# Patient Record
Sex: Male | Born: 2000 | Hispanic: No | Marital: Single | Smoking: Never smoker
Health system: Southern US, Community
[De-identification: ages and names within clinical notes are randomized; demographics above are authoritative.]

## PROBLEM LIST (undated history)

## (undated) DIAGNOSIS — Z789 Other specified health status: Secondary | ICD-10-CM

## (undated) HISTORY — PX: NO PAST SURGERIES: SHX2092

---
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
## 2022-01-17 ENCOUNTER — Encounter (HOSPITAL_COMMUNITY): Payer: Self-pay

## 2022-01-17 ENCOUNTER — Emergency Department (HOSPITAL_COMMUNITY)
Admission: EM | Admit: 2022-01-17 | Discharge: 2022-01-17 | Disposition: A | Discharge status: Home / Self Care | Payer: Medicaid Other | Attending: Emergency Medicine | Admitting: Emergency Medicine

## 2022-01-17 DIAGNOSIS — L0501 Pilonidal cyst with abscess: Secondary | ICD-10-CM | POA: Insufficient documentation

## 2022-01-17 MED ORDER — LIDOCAINE-EPINEPHRINE (PF) 2 %-1:200000 IJ SOLN
10.0000 mL | Freq: Once | INTRAMUSCULAR | Status: AC
  Administered 2022-01-17: 10 mL
  Filled 2022-01-17: qty 20

## 2022-01-17 NOTE — ED Provider Notes (Signed)
[facility]GENCY [facility]PT Provider Note   CSN: [id number] Arrival date & time: 01/17/22  1534     History  Chief Complaint  Patient presents with   Cyst    Brandon Santiago is a 21 y.o. male.  Patient with no pertinent past medical history presents today with complaints of gluteal abscess.  He states that same has been present for the past 2 months. He states that he will regularly pop it and drain bloody purulence, but soon after it comes back. He states that same is located in his upper gluteal cleft. Denies any pain or difficulty with bowel movements. Denies any fevers or chills.        Home Medications Prior to Admission medications   Not on File      Allergies    Patient has no known allergies.    Review of Systems   Review of Systems  Skin:  Positive for wound.  All other systems reviewed and are negative.  Physical Exam Updated Vital Signs BP 135/75   Pulse 91   Temp 98.4 F (36.9 C) (Oral)   Resp 18   SpO2 100%  Physical Exam Vitals and nursing note reviewed. Exam conducted with a chaperone present.  Constitutional:      General: He is not in acute distress.    Appearance: Normal appearance. He is normal weight. He is not ill-appearing, toxic-appearing or diaphoretic.  HENT:     Head: Normocephalic and atraumatic.  Cardiovascular:     Rate and Rhythm: Normal rate.  Pulmonary:     Effort: Pulmonary effort is normal. No respiratory distress.  Genitourinary:    Comments: Small pilonidal cyst present in the superior left side of the gluteal cleft. Fluctuance with surrounding induration present. No cellulitic skin changes. No active drainage Musculoskeletal:        General: Normal range of motion.     Cervical back: Normal range of motion.  Skin:    General: Skin is warm and dry.  Neurological:     General: No focal deficit present.     Mental Status: He is alert.  Psychiatric:        Mood and Affect: Mood normal.         Behavior: Behavior normal.    ED Results / Procedures / Treatments   Labs (all labs ordered are listed, but only abnormal results are displayed) Labs Reviewed - No data to display  EKG None  Radiology No results found.  Procedures .Marland KitchenIncision and Drainage  Date/Time: 01/17/2022 4:29 PM Performed by: Silva Bandy, PA-C Authorized by: Silva Bandy, PA-C   Consent:    Consent obtained:  Verbal   Consent given by:  Patient   Risks, benefits, and alternatives were discussed: yes     Risks discussed:  Bleeding, incomplete drainage, pain, damage to other organs and infection   Alternatives discussed:  No treatment Universal protocol:    Procedure explained and questions answered to patient or proxy's satisfaction: yes     Patient identity confirmed:  Verbally with patient Location:    Type:  Pilonidal cyst   Size:  1 cm   Location:  Anogenital   Anogenital location:  Pilonidal Pre-procedure details:    Skin preparation:  Povidone-iodine Anesthesia:    Anesthesia method:  Local infiltration   Local anesthetic:  Lidocaine 2% WITH epi Procedure type:    Complexity:  Simple Procedure details:    Incision types:  Cruciate   Wound management:  Probed and deloculated   Drainage:  Bloody and purulent   Drainage amount:  Moderate   Wound treatment:  Wound left open   Packing materials:  None Post-procedure details:    Procedure completion:  Tolerated well, no immediate complications    Medications Ordered in ED Medications  lidocaine-EPINEPHrine (XYLOCAINE W/EPI) 2 %-1:200000 (PF) injection 10 mL (has no administration in time range)    ED Course/ Medical Decision Making/ A&P                           Medical Decision Making Risk Prescription drug management.   Patient with pilonidal cyst amenable to incision and drainage.  Abscess was not large enough to warrant packing or drain, recommend wound recheck in 2 days. Encouraged home warm soaks and flushing.  Mild  signs of cellulitis is surrounding skin. No concern for tracking fistula.  Will d/c to home.  No antibiotic therapy is indicated.  We will give referral to general surgery for curative management of the patient's symptoms as he states he has been struggling with this for some time.  Patient is understanding and amenable to plan, educated on red flag symptoms of prompt immediate return.  Discharged in stable condition.   Final Clinical Impression(s) / ED Diagnoses Final diagnoses:  Pilonidal abscess    Rx / DC Orders ED Discharge Orders     None     An After Visit Summary was printed and given to the patient.     Vear Clock 01/17/22 1637    Lorre Nick, MD 01/19/22 1500

## 2022-04-05 ENCOUNTER — Ambulatory Visit (HOSPITAL_COMMUNITY)
Admission: EM | Admit: 2022-04-05 | Discharge: 2022-04-05 | Disposition: A | Discharge status: Home / Self Care | Payer: Medicaid Other | Attending: Emergency Medicine | Admitting: Emergency Medicine

## 2022-04-05 ENCOUNTER — Encounter (HOSPITAL_COMMUNITY): Payer: Self-pay

## 2022-04-05 DIAGNOSIS — L0291 Cutaneous abscess, unspecified: Secondary | ICD-10-CM | POA: Diagnosis not present

## 2022-04-05 MED ORDER — DOXYCYCLINE HYCLATE 100 MG PO CAPS: 100.0000 mg | ORAL_CAPSULE | Freq: Two times a day (BID) | ORAL | 0 refills | Status: DC

## 2022-04-05 NOTE — ED Triage Notes (Signed)
The pt states he has a bump to his left lower back for about 5 months. The abscess is closed and scabbed over. The patient states at times there is drainage to the area.

## 2022-04-05 NOTE — Discharge Instructions (Addendum)
Please take medication as prescribed. Take with food to avoid upset stomach.  Follow up with your new primary care provider.

## 2022-04-05 NOTE — ED Provider Notes (Signed)
[facility]    CSN: [id number] Arrival date & time: 04/05/22  1240     History   Chief Complaint Chief Complaint  Patient presents with   Abscess    HPI Brandon Santiago is a 21 y.o. male.  Presents with abscess to the lower back x 5 months. Has been on and off but always recurs in the same place. Sometimes it can be tender and grow in size.  Drainage on and off as well. He has not had it drained in the past. Denies any fever, chills, back pain, abdominal pain.  History reviewed. No pertinent past medical history.  There are no problems to display for this patient.   History reviewed. No pertinent surgical history.     Home Medications    Prior to Admission medications   Medication Sig Start Date End Date Taking? Authorizing Provider  doxycycline (VIBRAMYCIN) 100 MG capsule Take 1 capsule (100 mg total) by mouth 2 (two) times daily for 7 days. 04/05/22 04/12/22 Yes Marrianne Sica, Lurena Joiner, PA-C  ibuprofen (ADVIL,MOTRIN) 600 MG tablet Take 1 tablet (600 mg total) by mouth every 8 (eight) hours as needed for fever or moderate pain. 09/07/18   Blane Ohara, MD    Family History History reviewed. No pertinent family history.  Social History Social History   Tobacco Use   Smoking status: Never  Substance Use Topics   Alcohol use: No   Drug use: Not Currently    Types: Marijuana     Allergies   Patient has no known allergies.   Review of Systems Review of Systems Per HPI  Physical Exam Triage Vital Signs ED Triage Vitals  Enc Vitals Group     BP 04/05/22 1330 134/64     Pulse Rate 04/05/22 1330 85     Resp 04/05/22 1330 16     Temp 04/05/22 1330 98.5 F (36.9 C)     Temp Source 04/05/22 1330 Oral     SpO2 04/05/22 1330 100 %     Weight --      Height --      Head Circumference --      Peak Flow --      Pain Score 04/05/22 1329 0     Pain Loc --      Pain Edu? --      Excl. in GC? --    No data found.  Updated Vital Signs BP  134/64 (BP Location: Left Arm)   Pulse 85   Temp 98.5 F (36.9 C) (Oral)   Resp 16   SpO2 100%    Physical Exam Vitals and nursing note reviewed.  Constitutional:      General: He is not in acute distress.    Appearance: Normal appearance.  HENT:     Mouth/Throat:     Pharynx: Oropharynx is clear.  Eyes:     Conjunctiva/sclera: Conjunctivae normal.  Cardiovascular:     Rate and Rhythm: Normal rate and regular rhythm.     Pulses: Normal pulses.     Heart sounds: Normal heart sounds.  Pulmonary:     Effort: Pulmonary effort is normal. No respiratory distress.     Breath sounds: Normal breath sounds. No wheezing.  Musculoskeletal:       Back:     Comments: There is a small, open and draining abscess at the top of the left gluteal cleft. Purulent fluid. Somewhat tender  Skin:    Findings: Abscess present.  Neurological:     Mental  Status: He is alert and oriented to person, place, and time.     UC Treatments / Results  Labs (all labs ordered are listed, but only abnormal results are displayed) Labs Reviewed - No data to display  EKG  Radiology No results found.  Procedures Procedures   Medications Ordered in UC Medications - No data to display  Initial Impression / Assessment and Plan / UC Course  I have reviewed the triage vital signs and the nursing notes.  Pertinent labs & imaging results that were available during my care of the patient were reviewed by me and considered in my medical decision making (see chart for details).  Doxy twice daily for 7 days. Set patient up with primary care provider for next Thursday. May need referral to dermatology for recurrent abscess. Return precautions discussed. Patient agrees to plan  Final Clinical Impressions(s) / UC Diagnoses   Final diagnoses:  Abscess     Discharge Instructions      Please take medication as prescribed. Take with food to avoid upset stomach.  Follow up with your new primary care  provider.     ED Prescriptions     Medication Sig Dispense Auth. Provider   doxycycline (VIBRAMYCIN) 100 MG capsule Take 1 capsule (100 mg total) by mouth 2 (two) times daily for 7 days. 14 capsule Dalvin Clipper, Lurena Joiner, PA-C      PDMP not reviewed this encounter.   Basma Buchner, Lurena Joiner, [place] 04/05/22 1411

## 2022-04-06 ENCOUNTER — Encounter (HOSPITAL_COMMUNITY): Payer: Self-pay

## 2022-04-08 NOTE — Progress Notes (Unsigned)
   New Patient Visit  There were no vitals taken for this visit.   Subjective:    Patient ID: Brandon Santiago, male    DOB: 04-04-2001, 21 y.o.   MRN: [id number]  CC: No chief complaint on file.   HPI: Brandon Santiago is a 21 y.o. male presents for new patient visit to establish care.  Introduced to Publishing rights manager role and practice setting.  All questions answered.  Discussed provider/patient relationship and expectations.  No past medical history on file.  No past surgical history on file.  No family history on file.   Social History   Tobacco Use   Smoking status: Never  Substance Use Topics   Alcohol use: No   Drug use: Not Currently    Types: Marijuana    Current Outpatient Medications on File Prior to Visit  Medication Sig Dispense Refill   doxycycline (VIBRAMYCIN) 100 MG capsule Take 1 capsule (100 mg total) by mouth 2 (two) times daily for 7 days. 14 capsule 0   ibuprofen (ADVIL,MOTRIN) 600 MG tablet Take 1 tablet (600 mg total) by mouth every 8 (eight) hours as needed for fever or moderate pain. 30 tablet 0   No current facility-administered medications on file prior to visit.     Review of Systems      Objective:    There were no vitals taken for this visit.  Wt Readings from Last 3 Encounters:  10/12/21 170 lb (77.1 kg) (75 %, Z= 0.67)*  09/07/18 177 lb 0.5 oz (80.3 kg) (93 %, Z= 1.51)*   * Growth percentiles are based on CDC (Boys, 2-20 Years) data.    BP Readings from Last 3 Encounters:  04/05/22 134/64  01/17/22 113/65  10/12/21 130/68    Physical Exam     Assessment & Plan:   Problem List Items Addressed This Visit   None    Follow up plan: No follow-ups on file.

## 2022-04-09 ENCOUNTER — Encounter: Payer: Self-pay | Admitting: Nurse Practitioner

## 2022-04-09 ENCOUNTER — Ambulatory Visit (INDEPENDENT_AMBULATORY_CARE_PROVIDER_SITE_OTHER): Payer: Medicaid Other | Admitting: Nurse Practitioner

## 2022-04-09 VITALS — BP 120/90 | HR 75 | Temp 98.5°F | Ht 66.0 in | Wt 176.8 lb

## 2022-04-09 DIAGNOSIS — L0291 Cutaneous abscess, unspecified: Secondary | ICD-10-CM

## 2022-04-09 NOTE — Patient Instructions (Signed)
It was great to see you!  I have placed a referral to surgery to help remove the abscess on your back.   You can use warm compresses several times a day to help the area drain.   Let's follow-up in 2-3 months, sooner if you have concerns.  If a referral was placed today, you will be contacted for an appointment. Please note that routine referrals can sometimes take up to 3-4 weeks to process. Please call our office if you haven't heard anything after this time frame.  Take care,  Rodman Pickle, NP

## 2022-07-02 ENCOUNTER — Ambulatory Visit (INDEPENDENT_AMBULATORY_CARE_PROVIDER_SITE_OTHER): Payer: Medicaid Other | Admitting: Nurse Practitioner

## 2022-07-02 ENCOUNTER — Encounter: Payer: Self-pay | Admitting: Nurse Practitioner

## 2022-07-02 VITALS — BP 120/76 | HR 79 | Temp 98.2°F | Ht 66.0 in | Wt 183.6 lb

## 2022-07-02 DIAGNOSIS — Z0000 Encounter for general adult medical examination without abnormal findings: Secondary | ICD-10-CM | POA: Diagnosis not present

## 2022-07-02 DIAGNOSIS — L0291 Cutaneous abscess, unspecified: Secondary | ICD-10-CM | POA: Diagnosis not present

## 2022-07-02 DIAGNOSIS — R7301 Impaired fasting glucose: Secondary | ICD-10-CM | POA: Diagnosis not present

## 2022-07-02 LAB — COMPREHENSIVE METABOLIC PANEL
ALT: 33 U/L (ref 0–53)
AST: 22 U/L (ref 0–37)
Albumin: 4.7 g/dL (ref 3.5–5.2)
Alkaline Phosphatase: 85 U/L (ref 52–171)
BUN: 18 mg/dL (ref 6–23)
CO2: 29 mEq/L (ref 19–32)
Calcium: 9.5 mg/dL (ref 8.4–10.5)
Chloride: 104 mEq/L (ref 96–112)
Creatinine, Ser: 0.77 mg/dL (ref 0.40–1.50)
GFR: 129.74 mL/min (ref >60.00)
Glucose, Bld: 101 mg/dL — ABNORMAL HIGH (ref 70–99)
Potassium: 4.1 mEq/L (ref 3.5–5.1)
Sodium: 139 mEq/L (ref 135–145)
Total Bilirubin: 1 mg/dL (ref 0.2–1.2)
Total Protein: 7.3 g/dL (ref 6.0–8.3)

## 2022-07-02 LAB — CBC WITH DIFFERENTIAL/PLATELET
Basophils Absolute: 0 10*3/uL (ref 0.0–0.1)
Basophils Relative: 0.5 % (ref 0.0–3.0)
Eosinophils Absolute: 0.1 10*3/uL (ref 0.0–0.7)
Eosinophils Relative: 1.4 % (ref 0.0–5.0)
HCT: 50.2 % — ABNORMAL HIGH (ref 36.0–49.0)
Hemoglobin: 17 g/dL — ABNORMAL HIGH (ref 12.0–16.0)
Lymphocytes Relative: 21 % — ABNORMAL LOW (ref 24.0–48.0)
Lymphs Abs: 1.4 10*3/uL (ref 0.7–4.0)
MCHC: 33.9 g/dL (ref 31.0–37.0)
MCV: 91.6 fl (ref 78.0–98.0)
Monocytes Absolute: 0.7 10*3/uL (ref 0.1–1.0)
Monocytes Relative: 11 % (ref 3.0–12.0)
Neutro Abs: 4.4 10*3/uL (ref 1.4–7.7)
Neutrophils Relative %: 66.1 % (ref 43.0–71.0)
Platelets: 289 10*3/uL (ref 150.0–575.0)
RBC: 5.48 Mil/uL (ref 3.80–5.70)
RDW: 13.7 % (ref 11.4–15.5)
WBC: 6.6 10*3/uL (ref 4.5–13.5)

## 2022-07-02 LAB — HEMOGLOBIN A1C: Hgb A1c MFr Bld: 5.4 % (ref 4.6–6.5)

## 2022-07-02 NOTE — Progress Notes (Signed)
BP 120/76   Pulse 79   Temp 98.2 F (36.8 C) (Temporal)   Ht 5\' 6"  (1.676 m)   Wt 183 lb 9.6 oz (83.3 kg)   SpO2 98%   BMI 29.63 kg/m    Subjective:    Patient ID: Brandon Santiago, male    DOB: 17-Nov-2000, 21 y.o.   MRN: [id number]  HPI: Brandon Santiago is a 21 y.o. male presenting on 07/02/2022 for comprehensive medical examination. Current medical complaints include: abscess on back  He has not heard from [facility] [place] [facility] about the abscess on his back. He states that it is about the same right now. He denies fevers.   He currently lives with: mom  Depression Screen done today and results listed below:     07/02/2022    8:46 AM 04/09/2022    8:53 AM  Depression screen PHQ 2/9  Decreased Interest 0 0  Down, Depressed, Hopeless 0 0  PHQ - 2 Score 0 0  Altered sleeping  0  Tired, decreased energy  0  Change in appetite  1  Feeling bad or failure about yourself   0  Trouble concentrating  0  Moving slowly or fidgety/restless  0  Suicidal thoughts  0  PHQ-9 Score  1  Difficult doing work/chores  Not difficult at all    The patient does not have a history of falls. I did not complete a risk assessment for falls. A plan of care for falls was not documented.   Past Medical History:  History reviewed. No pertinent past medical history.  Surgical History:  History reviewed. No pertinent surgical history.  Medications:  No current outpatient medications on file prior to visit.   No current facility-administered medications on file prior to visit.    Allergies:  No Known Allergies  Social History:  Social History   Socioeconomic History   Marital status: Single    Spouse name: Not on file   Number of children: Not on file   Years of education: Not on file   Highest education level: Not on file  Occupational History   Not on file  Tobacco Use   Smoking status: Never   Smokeless tobacco: Never  Vaping Use   Vaping Use: Never used   Substance and Sexual Activity   Alcohol use: No   Drug use: Yes    Types: Marijuana   Sexual activity: Yes  Other Topics Concern   Not on file  Social History Narrative   ** Merged History Encounter **       Social Determinants of Health   Financial Resource Strain: Not on file  Food Insecurity: Not on file  Transportation Needs: Not on file  Physical Activity: Not on file  Stress: Not on file  Social Connections: Not on file  Intimate Partner Violence: Not on file   Social History   Tobacco Use  Smoking Status Never  Smokeless Tobacco Never   Social History   Substance and Sexual Activity  Alcohol Use No    Family History:  Family History  Problem Relation Age of Onset   Healthy Mother    Healthy Father     Past medical history, surgical history, medications, allergies, family history and social history reviewed with patient today and changes made to appropriate areas of the chart.   Review of Systems  Constitutional: Negative.   HENT: Negative.    Respiratory: Negative.    Cardiovascular: Negative.   Gastrointestinal: Negative.   Genitourinary: Negative.  Musculoskeletal: Negative.   Skin:        Abscess lower back  Neurological: Negative.   Psychiatric/Behavioral: Negative.     All other ROS negative except what is listed above and in the HPI.      Objective:    BP 120/76   Pulse 79   Temp 98.2 F (36.8 C) (Temporal)   Ht 5\' 6"  (1.676 m)   Wt 183 lb 9.6 oz (83.3 kg)   SpO2 98%   BMI 29.63 kg/m   Wt Readings from Last 3 Encounters:  07/02/22 183 lb 9.6 oz (83.3 kg) (84 %, Z= 1.00)*  04/09/22 176 lb 12.8 oz (80.2 kg) (79 %, Z= 0.82)*  10/12/21 170 lb (77.1 kg) (75 %, Z= 0.67)*   * Growth percentiles are based on CDC (Boys, 2-20 Years) data.    Physical Exam Vitals and nursing note reviewed.  Constitutional:      General: He is not in acute distress.    Appearance: Normal appearance.  HENT:     Head: Normocephalic and atraumatic.      Right Ear: Tympanic membrane, ear canal and external ear normal.     Left Ear: Tympanic membrane, ear canal and external ear normal.  Eyes:     Conjunctiva/sclera: Conjunctivae normal.  Cardiovascular:     Rate and Rhythm: Normal rate and regular rhythm.     Pulses: Normal pulses.     Heart sounds: Normal heart sounds.  Pulmonary:     Effort: Pulmonary effort is normal.     Breath sounds: Normal breath sounds.  Abdominal:     Palpations: Abdomen is soft.     Tenderness: There is no abdominal tenderness.  Musculoskeletal:        General: Normal range of motion.     Cervical back: Normal range of motion and neck supple. No tenderness.     Right lower leg: No edema.     Left lower leg: No edema.  Lymphadenopathy:     Cervical: No cervical adenopathy.  Skin:    General: Skin is warm and dry.     Comments: Pilonidal abscess, slightly draining  Neurological:     General: No focal deficit present.     Mental Status: He is alert and oriented to person, place, and time.     Cranial Nerves: No cranial nerve deficit.     Coordination: Coordination normal.     Gait: Gait normal.  Psychiatric:        Mood and Affect: Mood normal.        Behavior: Behavior normal.        Thought Content: Thought content normal.        Judgment: Judgment normal.     Results for orders placed or performed during the hospital encounter of 10/12/21  Resp Panel by RT-PCR (Flu A&B, Covid) Nasopharyngeal Swab   Specimen: Nasopharyngeal Swab; Nasopharyngeal(NP) swabs in vial transport medium  Result Value Ref Range   SARS Coronavirus 2 by RT PCR NEGATIVE NEGATIVE   Influenza A by PCR NEGATIVE NEGATIVE   Influenza B by PCR NEGATIVE NEGATIVE  Comprehensive metabolic panel  Result Value Ref Range   Sodium 135 135 - 145 mmol/L   Potassium 3.2 (L) 3.5 - 5.1 mmol/L   Chloride 100 98 - 111 mmol/L   CO2 22 22 - 32 mmol/L   Glucose, Bld 120 (H) 70 - 99 mg/dL   BUN 11 6 - 20 mg/dL   Creatinine, Ser 10/14/21 0.61 -  1.24 mg/dL   Calcium 9.2 8.9 - 10.3 mg/dL   Total Protein 7.1 6.5 - 8.1 g/dL   Albumin 4.1 3.5 - 5.0 g/dL   AST 25 15 - 41 U/L   ALT 43 0 - 44 U/L   Alkaline Phosphatase 90 38 - 126 U/L   Total Bilirubin 1.3 (H) 0.3 - 1.2 mg/dL   GFR, Estimated >60 >60 mL/min   Anion gap 13 5 - 15  CBC  Result Value Ref Range   WBC 12.0 (H) 4.0 - 10.5 K/uL   RBC 5.28 4.22 - 5.81 MIL/uL   Hemoglobin 16.4 13.0 - 17.0 g/dL   HCT 48.2 39.0 - 52.0 %   MCV 91.3 80.0 - 100.0 fL   MCH 31.1 26.0 - 34.0 pg   MCHC 34.0 30.0 - 36.0 g/dL   RDW 13.0 11.5 - 15.5 %   Platelets 292 150 - 400 K/uL   nRBC 0.0 0.0 - 0.2 %  Ethanol  Result Value Ref Range   Alcohol, Ethyl (B) <10 <10 mg/dL  Protime-INR  Result Value Ref Range   Prothrombin Time 12.6 11.4 - 15.2 seconds   INR 1.0 0.8 - 1.2  I-Stat Chem 8, ED  Result Value Ref Range   Sodium 139 135 - 145 mmol/L   Potassium 3.5 3.5 - 5.1 mmol/L   Chloride 102 98 - 111 mmol/L   BUN 13 6 - 20 mg/dL   Creatinine, Ser 0.80 0.61 - 1.24 mg/dL   Glucose, Bld 116 (H) 70 - 99 mg/dL   Calcium, Ion 1.10 (L) 1.15 - 1.40 mmol/L   TCO2 26 22 - 32 mmol/L   Hemoglobin 17.0 13.0 - 17.0 g/dL   HCT 50.0 39.0 - 52.0 %  Sample to Blood Bank  Result Value Ref Range   Blood Bank Specimen SAMPLE AVAILABLE FOR TESTING    Sample Expiration      10/13/2021,2359 Performed at [facility], [address], [place], [place] [address]       Assessment & Plan:   Problem List Items Addressed This Visit   None Visit Diagnoses     Routine general medical examination at a health care facility    -  Primary   Health maintenance reviewed and updated. Discussed nutrition, exercise. Check CMP, CBC. Follow-up 1 year   Relevant Orders   CBC with Differential/Platelet   Comprehensive metabolic panel   Abscess       Abscess located near buttock, most likely pilonidal abscess/cyst. He did not hear from [facility]. Phone number given for pt. to call.   IFG (impaired fasting glucose)        Glucose was elevated on prior labs, will check A1c today   Relevant Orders   Hemoglobin A1c        IMMUNIZATIONS:   - Tdap: Tetanus vaccination status reviewed: last tetanus booster within 10 years. - Influenza: Refused - Pneumovax: Not applicable - Prevnar: Not applicable - HPV: Up to date - Zostavax vaccine: Not applicable  SCREENING: - Colonoscopy: Not applicable  Discussed with patient purpose of the colonoscopy is to detect colon cancer at curable precancerous or early stages   - AAA Screening: Not applicable  -Hearing Test: Not applicable  -Spirometry: Not applicable   PATIENT COUNSELING:    Sexuality: Discussed sexually transmitted diseases, partner selection, use of condoms, avoidance of unintended pregnancy  and contraceptive alternatives.   Advised to avoid cigarette smoking.  I discussed with the patient that most people either abstain  from alcohol or drink within safe limits (<=14/week and <=4 drinks/occasion for males, <=7/weeks and <= 3 drinks/occasion for females) and that the risk for alcohol disorders and other health effects rises proportionally with the number of drinks per week and how often a drinker exceeds daily limits.  Discussed cessation/primary prevention of drug use and availability of treatment for abuse.   Diet: Encouraged to adjust caloric intake to maintain  or achieve ideal body weight, to reduce intake of dietary saturated fat and total fat, to limit sodium intake by avoiding high sodium foods and not adding table salt, and to maintain adequate dietary potassium and calcium preferably from fresh fruits, vegetables, and low-fat dairy products.    stressed the importance of regular exercise  Injury prevention: Discussed safety belts, safety helmets, smoke detector, smoking near bedding or upholstery.   Dental health: Discussed importance of regular tooth brushing, flossing, and dental visits.   Follow up plan: NEXT PREVENTATIVE PHYSICAL DUE IN  1 YEAR. Return in about 1 year (around 07/03/2023) for CPE.

## 2022-07-02 NOTE — Patient Instructions (Signed)
It was great to see you!  [facility] [address], [place], [place] [address] Phone: [phone]  Let's follow-up in 1 year, sooner if you have concerns.  If a referral was placed today, you will be contacted for an appointment. Please note that routine referrals can sometimes take up to 3-4 weeks to process. Please call our office if you haven't heard anything after this time frame.  Take care,  Rodman Pickle, NP

## 2022-07-15 ENCOUNTER — Ambulatory Visit: Payer: Self-pay | Admitting: Surgery

## 2022-07-15 NOTE — H&P (View-Only) (Signed)
History of Present Illness: Brandon Santiago is a 21 y.o. male who was referred to me for evaluation of a buttock abscess and concern for a pilonidal cyst. He had an I&D in the ED in June of this year. He has had symptoms for about 8-9 months now. He says the area intermittently swells and drains pus through a persistent wound on the lower back. He has frequent discomfort and symptoms.   He works as a Education administrator.     Review of Systems: A complete review of systems was obtained from the patient.  I have reviewed this information and discussed as appropriate with the patient.  See HPI as well for other ROS.     Medical History: Past Medical HistoryExpand by Default History reviewed. No pertinent past medical history.    Patient Active Problem List Diagnosis  Pilonidal cyst     Past Surgical History History reviewed. No pertinent surgical history.     Allergies No Known Allergies    No current outpatient medications on file prior to visit.   No current facility-administered medications on file prior to visit.     Family History History reviewed. No pertinent family history.     Social History   Tobacco Use Smoking Status Never Smokeless Tobacco Never     Social History Social History    Socioeconomic History  Marital status: Single Tobacco Use  Smoking status: Never  Smokeless tobacco: Never Substance and Sexual Activity  Alcohol use: Never  Drug use: Never      Objective:     Vitals:   07/15/22 1030 BP: 124/78 Pulse: 80 Temp: 36.9 C (98.5 F) SpO2: 96% Weight: 84.7 kg (186 lb 12.8 oz)   There is no height or weight on file to calculate BMI.   Physical Exam Vitals reviewed.  Constitutional:      General: He is not in acute distress.    Appearance: Normal appearance.  HENT:     Head: Normocephalic and atraumatic.  Eyes:     General: No scleral icterus.    Conjunctiva/sclera: Conjunctivae normal.  Cardiovascular:     Rate and Rhythm:  Normal rate and regular rhythm.  Pulmonary:     Effort: Pulmonary effort is normal. No respiratory distress.     Breath sounds: Normal breath sounds.  Genitourinary:    Comments: Multiple pits in the superior gluteal cleft, no surrounding erythema or induration. Superior to this, just to the left of midline, there is a wound about 2cm in diameter with extensive granulation tissue. No purulent drainage or fluctuance noted. Skin:    General: Skin is warm and dry.  Neurological:     General: No focal deficit present.     Mental Status: He is alert and oriented to person, place, and time.  Psychiatric:        Mood and Affect: Mood normal.        Behavior: Behavior normal.            Assessment and Plan: Diagnoses and all orders for this visit:   Pilonidal cyst     This is a 21 yo male presenting with a pilonidal cyst with an associated well-granulated sinus tract. He has very frequent symptoms. I reviewed surgical treatment options with the patient, and via a Spanish interpreter with his mother. Excision can be performed and will require excision of the pits at midline and the entire sinus tract. At least part of the wound will likely be left open to close by secondary  intention. I emphasized there will be wound care needed for at least a few weeks postop, and pain can be expected while the wound is healing. The patient and his mother expressed understanding and patient would like to proceed with surgery. He will be contacted to schedule an elective surgery date.  Sophronia Simas, MD [facility] Surgery General, Hepatobiliary and Pancreatic Surgery 07/15/22 2:02 PM

## 2022-07-15 NOTE — H&P (Signed)
History of Present Illness: Brandon Santiago is a 21 y.o. male who was referred to me for evaluation of a buttock abscess and concern for a pilonidal cyst. He had an I&D in the ED in June of this year. He has had symptoms for about 8-9 months now. He says the area intermittently swells and drains pus through a persistent wound on the lower back. He has frequent discomfort and symptoms.   He works as a Education administrator.     Review of Systems: A complete review of systems was obtained from the patient.  I have reviewed this information and discussed as appropriate with the patient.  See HPI as well for other ROS.     Medical History: Past Medical HistoryExpand by Default History reviewed. No pertinent past medical history.    Patient Active Problem List Diagnosis  Pilonidal cyst     Past Surgical History History reviewed. No pertinent surgical history.     Allergies No Known Allergies    No current outpatient medications on file prior to visit.   No current facility-administered medications on file prior to visit.     Family History History reviewed. No pertinent family history.     Social History   Tobacco Use Smoking Status Never Smokeless Tobacco Never     Social History Social History    Socioeconomic History  Marital status: Single Tobacco Use  Smoking status: Never  Smokeless tobacco: Never Substance and Sexual Activity  Alcohol use: Never  Drug use: Never      Objective:     Vitals:   07/15/22 1030 BP: 124/78 Pulse: 80 Temp: 36.9 C (98.5 F) SpO2: 96% Weight: 84.7 kg (186 lb 12.8 oz)   There is no height or weight on file to calculate BMI.   Physical Exam Vitals reviewed.  Constitutional:      General: He is not in acute distress.    Appearance: Normal appearance.  HENT:     Head: Normocephalic and atraumatic.  Eyes:     General: No scleral icterus.    Conjunctiva/sclera: Conjunctivae normal.  Cardiovascular:     Rate and Rhythm:  Normal rate and regular rhythm.  Pulmonary:     Effort: Pulmonary effort is normal. No respiratory distress.     Breath sounds: Normal breath sounds.  Genitourinary:    Comments: Multiple pits in the superior gluteal cleft, no surrounding erythema or induration. Superior to this, just to the left of midline, there is a wound about 2cm in diameter with extensive granulation tissue. No purulent drainage or fluctuance noted. Skin:    General: Skin is warm and dry.  Neurological:     General: No focal deficit present.     Mental Status: He is alert and oriented to person, place, and time.  Psychiatric:        Mood and Affect: Mood normal.        Behavior: Behavior normal.            Assessment and Plan: Diagnoses and all orders for this visit:   Pilonidal cyst     This is a 21 yo male presenting with a pilonidal cyst with an associated well-granulated sinus tract. He has very frequent symptoms. I reviewed surgical treatment options with the patient, and via a Spanish interpreter with his mother. Excision can be performed and will require excision of the pits at midline and the entire sinus tract. At least part of the wound will likely be left open to close by secondary  intention. I emphasized there will be wound care needed for at least a few weeks postop, and pain can be expected while the wound is healing. The patient and his mother expressed understanding and patient would like to proceed with surgery. He will be contacted to schedule an elective surgery date.  Sophronia Simas, MD [facility] Surgery General, Hepatobiliary and Pancreatic Surgery 07/15/22 2:02 PM

## 2022-07-16 ENCOUNTER — Encounter: Payer: Self-pay | Admitting: Nurse Practitioner

## 2022-07-27 ENCOUNTER — Telehealth: Payer: Self-pay | Admitting: Nurse Practitioner

## 2022-07-27 NOTE — Telephone Encounter (Signed)
Caller Name: Jon Gills Call back phone #: [phone]  Reason for Call: Please call back regarding recent test results

## 2022-07-27 NOTE — Telephone Encounter (Signed)
Called and spoke with pt and sister informed him of his lab results and pt also also wanted to know the status of his referral  , informed pt that his  referral was sent to there office [place] . Give him the information to call there office .

## 2022-08-05 ENCOUNTER — Encounter (HOSPITAL_COMMUNITY): Payer: Self-pay | Admitting: Surgery

## 2022-08-05 ENCOUNTER — Other Ambulatory Visit: Payer: Self-pay

## 2022-08-05 NOTE — Progress Notes (Signed)
PCP - Rodman Pickle, NP Cardiologist - denies  PPM/ICD - denies  Fasting Blood Sugar - n/a  Blood Thinner Instructions: n/a Aspirin Instructions: Patient was instructed: As of today, STOP taking any Aspirin (unless otherwise instructed by your surgeon) Aleve, Naproxen, Ibuprofen, Motrin, Advil, Goody's, BC's, all herbal medications, fish oil, and all vitamins.  ERAS Protcol - yes, until 09:00 o'clock  COVID TEST- n/a  Anesthesia review: n/a  Patient verbally denies any shortness of breath, fever, cough and chest pain during phone call   -------------  SDW INSTRUCTIONS given:  Your procedure is scheduled on Friday, December 22nd, 2023.  Report to [facility] Main Entrance "A" at 09:30 A.M., and check in at the Admitting office.  Call this number if you have problems the morning of surgery:  [phone]   Remember:  Do not eat after midnight the night before your surgery  You may drink clear liquids until 09:00 the morning of your surgery.   Clear liquids allowed are: Water, Non-Citrus Juices (without pulp), Carbonated Beverages, Clear Tea, Black Coffee Only, and Gatorade    Take these medicines the morning of surgery with A SIP OF WATER - none   The day of surgery:                     Do not wear jewelry,             Do not wear lotions, powders, colognes, or deodorant.            Men may shave face and neck.            Do not bring valuables to the hospital.            [facility] is not responsible for any belongings or valuables.  Do NOT Smoke (Tobacco/Vaping) 24 hours prior to your procedure If you use a CPAP at night, you may bring all equipment for your overnight stay.   Contacts, glasses, dentures or bridgework may not be worn into surgery.      For patients admitted to the hospital, discharge time will be determined by your treatment team.   Patients discharged the day of surgery will not be allowed to drive home, and someone needs to stay with them for 24  hours.    Special instructions:   [facility]- Preparing For Surgery  Before surgery, you can play an important role. Because skin is not sterile, your skin needs to be as free of germs as possible. You can reduce the number of germs on your skin by washing with CHG (chlorahexidine gluconate) Soap before surgery.  CHG is an antiseptic cleaner which kills germs and bonds with the skin to continue killing germs even after washing.    Oral Hygiene is also important to reduce your risk of infection.  Remember - BRUSH YOUR TEETH THE MORNING OF SURGERY WITH YOUR REGULAR TOOTHPASTE  Please do not use if you have an allergy to CHG or antibacterial soaps. If your skin becomes reddened/irritated stop using the CHG.  Do not shave (including legs and underarms) for at least 48 hours prior to first CHG shower. It is OK to shave your face.  Please follow these instructions carefully.   Shower the NIGHT BEFORE SURGERY and the MORNING OF SURGERY with DIAL Soap.   Pat yourself dry with a CLEAN TOWEL.  Wear CLEAN PAJAMAS to bed the night before surgery  Place CLEAN SHEETS on your bed the night of your first shower  and DO NOT SLEEP WITH PETS.   Day of Surgery: Please shower morning of surgery  Wear Clean/Comfortable clothing the morning of surgery Do not apply any deodorants/lotions.   Remember to brush your teeth WITH YOUR REGULAR TOOTHPASTE.   Questions were answered. Patient verbalized understanding of instructions.

## 2022-08-06 NOTE — Anesthesia Preprocedure Evaluation (Addendum)
Anesthesia Evaluation  Patient identified by MRN, date of birth, ID band Patient awake    Reviewed: Allergy & Precautions, NPO status , Patient's Chart, lab work & pertinent test results  Airway Mallampati: I  TM Distance: >3 FB Neck ROM: Full    Dental no notable dental hx. (+) Teeth Intact, Dental Advisory Given   Pulmonary neg pulmonary ROS, Patient abstained from smoking.   Pulmonary exam normal breath sounds clear to auscultation       Cardiovascular negative cardio ROS Normal cardiovascular exam Rhythm:Regular Rate:Normal     Neuro/Psych negative neurological ROS  negative psych ROS   GI/Hepatic negative GI ROS, Neg liver ROS,,,  Endo/Other  negative endocrine ROS    Renal/GU negative Renal ROS  negative genitourinary   Musculoskeletal negative musculoskeletal ROS (+)    Abdominal   Peds  Hematology negative hematology ROS (+)   Anesthesia Other Findings   Reproductive/Obstetrics                             Anesthesia Physical Anesthesia Plan  ASA: 1  Anesthesia Plan: General   Post-op Pain Management: Tylenol PO (pre-op)* and Precedex   Induction: Intravenous  PONV Risk Score and Plan: 2 and Midazolam, Dexamethasone and Ondansetron  Airway Management Planned: Oral ETT  Additional Equipment:   Intra-op Plan:   Post-operative Plan: Extubation in OR  Informed Consent: I have reviewed the patients History and Physical, chart, labs and discussed the procedure including the risks, benefits and alternatives for the proposed anesthesia with the patient or authorized representative who has indicated his/her understanding and acceptance.     Dental advisory given  Plan Discussed with: CRNA  Anesthesia Plan Comments:        Anesthesia Quick Evaluation

## 2022-08-07 ENCOUNTER — Other Ambulatory Visit: Payer: Self-pay

## 2022-08-07 ENCOUNTER — Encounter (HOSPITAL_COMMUNITY): Payer: Self-pay | Admitting: Surgery

## 2022-08-07 ENCOUNTER — Ambulatory Visit (HOSPITAL_COMMUNITY)
Admission: RE | Admit: 2022-08-07 | Discharge: 2022-08-07 | Disposition: A | Discharge status: Home / Self Care | Payer: Medicaid Other | Attending: Surgery | Admitting: Surgery

## 2022-08-07 ENCOUNTER — Ambulatory Visit (HOSPITAL_COMMUNITY): Payer: Medicaid Other | Admitting: Anesthesiology

## 2022-08-07 ENCOUNTER — Ambulatory Visit (HOSPITAL_BASED_OUTPATIENT_CLINIC_OR_DEPARTMENT_OTHER): Payer: Medicaid Other | Admitting: Anesthesiology

## 2022-08-07 ENCOUNTER — Encounter (HOSPITAL_COMMUNITY)
Admission: RE | Disposition: A | Discharge status: Home / Self Care | Payer: Self-pay | Source: Home / Self Care | Attending: Surgery

## 2022-08-07 DIAGNOSIS — L0591 Pilonidal cyst without abscess: Secondary | ICD-10-CM

## 2022-08-07 HISTORY — PX: PILONIDAL CYST EXCISION: SHX744

## 2022-08-07 HISTORY — DX: Other specified health status: Z78.9

## 2022-08-07 SURGERY — EXCISION, PILONIDAL CYST, EXTENSIVE
Anesthesia: General

## 2022-08-07 MED ORDER — HYDROMORPHONE HCL 1 MG/ML IJ SOLN
INTRAMUSCULAR | Status: DC | PRN
  Administered 2022-08-07: .5 mg via INTRAVENOUS

## 2022-08-07 MED ORDER — PHENYLEPHRINE 80 MCG/ML (10ML) SYRINGE FOR IV PUSH (FOR BLOOD PRESSURE SUPPORT)
PREFILLED_SYRINGE | INTRAVENOUS | Status: DC | PRN
  Administered 2022-08-07 (×2): 160 ug via INTRAVENOUS

## 2022-08-07 MED ORDER — DEXMEDETOMIDINE HCL IN NACL 80 MCG/20ML IV SOLN
INTRAVENOUS | Status: AC
  Filled 2022-08-07: qty 20

## 2022-08-07 MED ORDER — ONDANSETRON HCL 4 MG/2ML IJ SOLN
INTRAMUSCULAR | Status: AC
  Filled 2022-08-07: qty 2

## 2022-08-07 MED ORDER — LACTATED RINGERS IV SOLN: INTRAVENOUS | Status: DC

## 2022-08-07 MED ORDER — DEXAMETHASONE SODIUM PHOSPHATE 10 MG/ML IJ SOLN
INTRAMUSCULAR | Status: DC | PRN
  Administered 2022-08-07: 10 mg via INTRAVENOUS

## 2022-08-07 MED ORDER — CEFAZOLIN SODIUM-DEXTROSE 2-4 GM/100ML-% IV SOLN
2.0000 g | INTRAVENOUS | Status: AC
  Administered 2022-08-07: 2 g via INTRAVENOUS
  Filled 2022-08-07: qty 100

## 2022-08-07 MED ORDER — SUGAMMADEX SODIUM 200 MG/2ML IV SOLN
INTRAVENOUS | Status: DC | PRN
  Administered 2022-08-07: 300 mg via INTRAVENOUS

## 2022-08-07 MED ORDER — PROPOFOL 10 MG/ML IV BOLUS
INTRAVENOUS | Status: AC
  Filled 2022-08-07: qty 20

## 2022-08-07 MED ORDER — ACETAMINOPHEN 500 MG PO TABS: 1000.0000 mg | ORAL_TABLET | ORAL | Status: DC

## 2022-08-07 MED ORDER — CELECOXIB 200 MG PO CAPS
200.0000 mg | ORAL_CAPSULE | ORAL | Status: AC
  Administered 2022-08-07: 200 mg via ORAL
  Filled 2022-08-07: qty 1

## 2022-08-07 MED ORDER — FENTANYL CITRATE (PF) 250 MCG/5ML IJ SOLN
INTRAMUSCULAR | Status: DC | PRN
  Administered 2022-08-07: 100 ug via INTRAVENOUS
  Administered 2022-08-07: 50 ug via INTRAVENOUS
  Administered 2022-08-07: 100 ug via INTRAVENOUS

## 2022-08-07 MED ORDER — MIDAZOLAM HCL 2 MG/2ML IJ SOLN
INTRAMUSCULAR | Status: DC | PRN
  Administered 2022-08-07: 2 mg via INTRAVENOUS

## 2022-08-07 MED ORDER — DEXMEDETOMIDINE HCL IN NACL 80 MCG/20ML IV SOLN
INTRAVENOUS | Status: DC | PRN
  Administered 2022-08-07: 16 ug via BUCCAL
  Administered 2022-08-07: 4 ug via BUCCAL

## 2022-08-07 MED ORDER — PROPOFOL 10 MG/ML IV BOLUS
INTRAVENOUS | Status: DC | PRN
  Administered 2022-08-07: 50 mg via INTRAVENOUS
  Administered 2022-08-07: 150 mg via INTRAVENOUS

## 2022-08-07 MED ORDER — LIDOCAINE 2% (20 MG/ML) 5 ML SYRINGE
INTRAMUSCULAR | Status: DC | PRN
  Administered 2022-08-07: 60 mg via INTRAVENOUS

## 2022-08-07 MED ORDER — FENTANYL CITRATE (PF) 250 MCG/5ML IJ SOLN
INTRAMUSCULAR | Status: AC
  Filled 2022-08-07: qty 5

## 2022-08-07 MED ORDER — LIDOCAINE 2% (20 MG/ML) 5 ML SYRINGE
INTRAMUSCULAR | Status: AC
  Filled 2022-08-07: qty 5

## 2022-08-07 MED ORDER — ROCURONIUM BROMIDE 10 MG/ML (PF) SYRINGE
PREFILLED_SYRINGE | INTRAVENOUS | Status: AC
  Filled 2022-08-07: qty 10

## 2022-08-07 MED ORDER — ROCURONIUM BROMIDE 10 MG/ML (PF) SYRINGE
PREFILLED_SYRINGE | INTRAVENOUS | Status: DC | PRN
  Administered 2022-08-07: 80 mg via INTRAVENOUS

## 2022-08-07 MED ORDER — ORAL CARE MOUTH RINSE: 15.0000 mL | Freq: Once | OROMUCOSAL | Status: AC

## 2022-08-07 MED ORDER — HYDROCODONE-ACETAMINOPHEN 5-325 MG PO TABS: 1.0000 | ORAL_TABLET | Freq: Four times a day (QID) | ORAL | 0 refills | Status: AC | PRN

## 2022-08-07 MED ORDER — CHLORHEXIDINE GLUCONATE 0.12 % MT SOLN
15.0000 mL | Freq: Once | OROMUCOSAL | Status: AC
  Administered 2022-08-07: 15 mL via OROMUCOSAL
  Filled 2022-08-07: qty 15

## 2022-08-07 MED ORDER — MIDAZOLAM HCL 2 MG/2ML IJ SOLN
INTRAMUSCULAR | Status: AC
  Filled 2022-08-07: qty 2

## 2022-08-07 MED ORDER — BUPIVACAINE-EPINEPHRINE (PF) 0.25% -1:200000 IJ SOLN
INTRAMUSCULAR | Status: DC | PRN
  Administered 2022-08-07: 30 mL

## 2022-08-07 MED ORDER — ONDANSETRON HCL 4 MG/2ML IJ SOLN
INTRAMUSCULAR | Status: DC | PRN
  Administered 2022-08-07: 4 mg via INTRAVENOUS

## 2022-08-07 MED ORDER — 0.9 % SODIUM CHLORIDE (POUR BTL) OPTIME
TOPICAL | Status: DC | PRN
  Administered 2022-08-07: 1000 mL
  Administered 2022-08-07: 500 mL

## 2022-08-07 MED ORDER — BUPIVACAINE HCL (PF) 0.25 % IJ SOLN
INTRAMUSCULAR | Status: AC
  Filled 2022-08-07: qty 30

## 2022-08-07 MED ORDER — ACETAMINOPHEN 500 MG PO TABS
1000.0000 mg | ORAL_TABLET | Freq: Once | ORAL | Status: AC
  Administered 2022-08-07: 1000 mg via ORAL
  Filled 2022-08-07: qty 2

## 2022-08-07 MED ORDER — HYDROMORPHONE HCL 1 MG/ML IJ SOLN
INTRAMUSCULAR | Status: AC
  Filled 2022-08-07: qty 0.5

## 2022-08-07 MED ORDER — DEXAMETHASONE SODIUM PHOSPHATE 10 MG/ML IJ SOLN
INTRAMUSCULAR | Status: AC
  Filled 2022-08-07: qty 1

## 2022-08-07 SURGICAL SUPPLY — 41 items
BAG COUNTER SPONGE SURGICOUNT (BAG) ×1 IMPLANT
CANISTER SUCT 3000ML PPV (MISCELLANEOUS) ×1 IMPLANT
CHLORAPREP W/TINT 26 (MISCELLANEOUS) ×1 IMPLANT
COVER SURGICAL LIGHT HANDLE (MISCELLANEOUS) ×1 IMPLANT
DERMABOND ADVANCED .7 DNX12 (GAUZE/BANDAGES/DRESSINGS) ×1 IMPLANT
DRAPE LAPAROSCOPIC ABDOMINAL (DRAPES) IMPLANT
DRAPE LAPAROTOMY 100X72 PEDS (DRAPES) IMPLANT
DRSG TEGADERM 4X4.75 (GAUZE/BANDAGES/DRESSINGS) IMPLANT
ELECT REM PT RETURN 9FT ADLT (ELECTROSURGICAL) ×1
ELECTRODE REM PT RTRN 9FT ADLT (ELECTROSURGICAL) ×1 IMPLANT
GAUZE 4X4 16PLY ~~LOC~~+RFID DBL (SPONGE) IMPLANT
GAUZE PAD ABD 8X10 STRL (GAUZE/BANDAGES/DRESSINGS) IMPLANT
GAUZE SPONGE 4X4 12PLY STRL (GAUZE/BANDAGES/DRESSINGS) IMPLANT
GLOVE BIOGEL PI IND STRL 6 (GLOVE) ×1 IMPLANT
GLOVE BIOGEL PI MICRO STRL 5.5 (GLOVE) ×1 IMPLANT
GOWN STRL REUS W/ TWL LRG LVL3 (GOWN DISPOSABLE) ×1 IMPLANT
GOWN STRL REUS W/TWL LRG LVL3 (GOWN DISPOSABLE) ×1
KIT BASIN OR (CUSTOM PROCEDURE TRAY) ×1 IMPLANT
KIT TURNOVER KIT B (KITS) ×1 IMPLANT
MARKER SKIN DUAL TIP RULER LAB (MISCELLANEOUS) ×1 IMPLANT
NDL HYPO 25GX1X1/2 BEV (NEEDLE) ×1 IMPLANT
NEEDLE HYPO 25GX1X1/2 BEV (NEEDLE) ×1 IMPLANT
NS IRRIG 1000ML POUR BTL (IV SOLUTION) ×1 IMPLANT
PACK GENERAL/GYN (CUSTOM PROCEDURE TRAY) ×1 IMPLANT
PAD ARMBOARD 7.5X6 YLW CONV (MISCELLANEOUS) ×2 IMPLANT
PENCIL SMOKE EVACUATOR (MISCELLANEOUS) ×1 IMPLANT
SOL PREP POV-IOD 4OZ 10% (MISCELLANEOUS) IMPLANT
SPECIMEN JAR SMALL (MISCELLANEOUS) ×1 IMPLANT
SPONGE T-LAP 4X18 ~~LOC~~+RFID (SPONGE) IMPLANT
STRIP CLOSURE SKIN 1/2X4 (GAUZE/BANDAGES/DRESSINGS) IMPLANT
SUT ETHILON 2 0 FS 18 (SUTURE) IMPLANT
SUT MON AB 4-0 PC3 18 (SUTURE) ×1 IMPLANT
SUT SILK 2 0 PERMA HAND 18 BK (SUTURE) IMPLANT
SUT VIC AB 0 CT1 18XCR BRD8 (SUTURE) IMPLANT
SUT VIC AB 0 CT1 8-18 (SUTURE) ×2
SUT VIC AB 3-0 SH 18 (SUTURE) IMPLANT
SUT VIC AB 3-0 SH 27 (SUTURE) ×1
SUT VIC AB 3-0 SH 27X BRD (SUTURE) ×1 IMPLANT
SYR CONTROL 10ML LL (SYRINGE) ×1 IMPLANT
TOWEL GREEN STERILE (TOWEL DISPOSABLE) ×1 IMPLANT
TOWEL GREEN STERILE FF (TOWEL DISPOSABLE) ×1 IMPLANT

## 2022-08-07 NOTE — Op Note (Signed)
Date: 08/07/22  Patient: Brandon Santiago MRN: [id number]  Preoperative Diagnosis: Pilonidal disease Postoperative Diagnosis: Same  Procedure: Excision of pilonidal cyst  Surgeon: Sophronia Simas, MD  EBL: Minimal  Anesthesia: General endotracheal  Specimens: Pilonidal cyst  Indications: [name] is a 21 yo male who presented with a draining wound on the buttock, consistent with pilonidal disease. He has had a chronic symptomatic wound for many months. After a discussion of the risks and benefits of surgery, he agreed to proceed with excision.  Findings: Pilonidal disease with a large granulating sinus superior to the gluteal cleft, and multiple pits at midline. No evidence of abscess or infection.  Procedure details: Informed consent was obtained in the preoperative area prior to the procedure. The patient was brought to the operating room, general anesthesia was induced and appropriate lines and drains were placed for intraoperative monitoring. Perioperative antibiotics were administered per SCIP guidelines. The patient was placed in the prone position, and he was prepped and draped in the usual sterile fashion. A pre-procedure timeout was taken verifying patient identity, surgical site and procedure to be performed.  There were multiple pits in the superior gluteal cleft. Superior to this just to the left of midline on the upper gluteus, there was a granulating wound approximately 3cm in largest dimension. An elliptical incision was marked out to include the granulating sinus wound and the pits at midline. The subcutaneous tissue was divided with cautery down to the sacral fascia. The specimen was taken off the fascia with cautery, and passed off the field. The remaining wound was examined and the subcutaneous tissue and fascia appeared normal and health with no residual pilonidal disease. The wound was irrigated and hemostasis was achieved with cautery. The wound was too large  to be fully closed primarily. The superior aspect could be re-approximated with minimal tension. The subcutaneous layer was closed in the superior half of the wound with interrupted 0 Vicryl sutures. In the inferior aspect, the skin was marsupialized to the muscle fascia using interrupted 0 Vicryl sutures. The skin was closed at the superior portion of the wound with 2-0 Nylon horizontal mattress sutures. The open portion of the wound was packed with saline-moistened gauze, and covered with dry gauze and an ABD pad.  The patient tolerated the procedure well with no apparent complications. All counts were correct x2 at the end of the procedure. The patient was extubated and taken to PACU in stable condition.  Sophronia Simas, MD 08/07/22 12:00 PM

## 2022-08-07 NOTE — Interval H&P Note (Signed)
History and Physical Interval Note:  08/07/2022 10:17 AM  Brandon Santiago  has presented today for surgery, with the diagnosis of PILONIDAL CYST.  The various methods of treatment have been discussed with the patient and family. After consideration of risks, benefits and other options for treatment, the patient has consented to  Procedure(s): EXCISION OF PILONIDAL CYST (N/A) as a surgical intervention.  The patient's history has been reviewed, patient examined, no change in status, stable for surgery.  I have reviewed the patient's chart and labs.  Questions were answered to the patient's satisfaction.     Fritzi Mandes

## 2022-08-07 NOTE — Anesthesia Procedure Notes (Signed)
Procedure Name: Intubation Date/Time: 08/07/2022 11:05 AM  Performed by: Lind Guest, CRNAPre-anesthesia Checklist: Patient identified, Emergency Drugs available, Suction available, Patient being monitored and Timeout performed Patient Re-evaluated:Patient Re-evaluated prior to induction Oxygen Delivery Method: Circle system utilized Preoxygenation: Pre-oxygenation with 100% oxygen Induction Type: IV induction Ventilation: Mask ventilation without difficulty Laryngoscope Size: Mac and 4 Grade View: Grade I Tube type: Oral Tube size: 7.5 mm Number of attempts: 1 Placement Confirmation: ETT inserted through vocal cords under direct vision, positive ETCO2 and breath sounds checked- equal and bilateral Secured at: 23 cm Tube secured with: Tape Dental Injury: Teeth and Oropharynx as per pre-operative assessment

## 2022-08-07 NOTE — Transfer of Care (Signed)
Immediate Anesthesia Transfer of Care Note  Patient: Brandon Santiago  Procedure(s) Performed: EXCISION OF PILONIDAL CYST  Patient Location: PACU  Anesthesia Type:General  Level of Consciousness: drowsy and patient cooperative  Airway & Oxygen Therapy: Patient Spontanous Breathing and Patient connected to face mask oxygen  Post-op Assessment: Report given to RN and Post -op Vital signs reviewed and stable  Post vital signs: Reviewed and stable  Last Vitals:  Vitals Value Taken Time  BP 139/71 08/07/22 1207  Temp    Pulse 95 08/07/22 1210  Resp 23 08/07/22 1210  SpO2 93 % 08/07/22 1210  Vitals shown include unvalidated device data.  Last Pain:  Vitals:   08/07/22 0950  TempSrc:   PainSc: 0-No pain         Complications: No notable events documented.

## 2022-08-07 NOTE — Discharge Instructions (Addendum)
[facility] DISCHARGE INSTRUCTIONS   Activity You may sit, but do not bend over at the waist more than 90 degrees - this is to prevent unnecessary tension on your incision. You may shower over your wound, but do not bathe or submerge it underwater. Do not drive while taking narcotic pain medication.   Wound Care Part of your wound is open. You should gently pack this with a piece of saline-moistened gauze. Cover with dry gauze.  The gauze dressing should be changed at least every day, and more often as needed if saturated or dirty. It is normal to have drainage from your wound. It should be pink or clear yellow in color. If you notice thick, cloudy, or foul-smelling drainage, please call the office.   When to Call us: Fever greater than 100.5 New redness, foul-smelling drainage, or swelling at incision site Severe pain, nausea, or vomiting     Follow-up You have an appointment scheduled with Dr. Freida Busman on August 18, 2022 at 9:30am. This will be at the [facility] Surgery office at [address], [address], [place], [place]. Please arrive at least 15 minutes prior to your scheduled appointment time.   For questions or concerns, please call the office at [phone].

## 2022-08-10 ENCOUNTER — Encounter (HOSPITAL_COMMUNITY): Payer: Self-pay | Admitting: Surgery

## 2022-08-11 LAB — SURGICAL PATHOLOGY

## 2022-08-11 NOTE — Anesthesia Postprocedure Evaluation (Signed)
Anesthesia Post Note  Patient: Kylar Rivera-Salazar  Procedure(s) Performed: EXCISION OF PILONIDAL CYST     Patient location during evaluation: PACU Anesthesia Type: General Level of consciousness: awake and alert Pain management: pain level controlled Vital Signs Assessment: post-procedure vital signs reviewed and stable Respiratory status: spontaneous breathing, nonlabored ventilation, respiratory function stable and patient connected to nasal cannula oxygen Cardiovascular status: blood pressure returned to baseline and stable Postop Assessment: no apparent nausea or vomiting Anesthetic complications: no  No notable events documented.  Last Vitals:  Vitals:   08/07/22 1230 08/07/22 1241  BP: 139/69 137/73  Pulse: 91 91  Resp: (!) 24 20  Temp:  36.9 C  SpO2: 94% 93%    Last Pain:  Vitals:   08/07/22 1207  TempSrc:   PainSc: Asleep   Pain Goal:                   Maribeth Jiles L Kaleena Corrow
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
## 2023-11-15 ENCOUNTER — Encounter: Payer: Self-pay | Admitting: Nurse Practitioner

## 2024-04-07 ENCOUNTER — Emergency Department (HOSPITAL_COMMUNITY)
Admission: EM | Admit: 2024-04-07 | Discharge: 2024-04-07 | Disposition: A | Discharge status: Home / Self Care | Attending: Emergency Medicine | Admitting: Emergency Medicine

## 2024-04-07 ENCOUNTER — Other Ambulatory Visit: Payer: Self-pay

## 2024-04-07 ENCOUNTER — Emergency Department (HOSPITAL_COMMUNITY)

## 2024-04-07 DIAGNOSIS — Z23 Encounter for immunization: Secondary | ICD-10-CM | POA: Diagnosis not present

## 2024-04-07 DIAGNOSIS — Y92410 Unspecified street and highway as the place of occurrence of the external cause: Secondary | ICD-10-CM | POA: Insufficient documentation

## 2024-04-07 DIAGNOSIS — S1093XA Contusion of unspecified part of neck, initial encounter: Secondary | ICD-10-CM | POA: Diagnosis not present

## 2024-04-07 DIAGNOSIS — S20311A Abrasion of right front wall of thorax, initial encounter: Secondary | ICD-10-CM | POA: Insufficient documentation

## 2024-04-07 DIAGNOSIS — S199XXA Unspecified injury of neck, initial encounter: Secondary | ICD-10-CM | POA: Diagnosis present

## 2024-04-07 LAB — CBC
HCT: 43.1 % (ref 39.0–52.0)
Hemoglobin: 14.5 g/dL (ref 13.0–17.0)
MCH: 30.9 pg (ref 26.0–34.0)
MCHC: 33.6 g/dL (ref 30.0–36.0)
MCV: 91.7 fL (ref 80.0–100.0)
Platelets: 258 K/uL (ref 150–400)
RBC: 4.7 MIL/uL (ref 4.22–5.81)
RDW: 13.2 % (ref 11.5–15.5)
WBC: 15.9 K/uL — ABNORMAL HIGH (ref 4.0–10.5)
nRBC: 0 % (ref 0.0–0.2)

## 2024-04-07 LAB — COMPREHENSIVE METABOLIC PANEL WITH GFR
ALT: 21 U/L (ref 0–44)
AST: 24 U/L (ref 15–41)
Albumin: 4.1 g/dL (ref 3.5–5.0)
Alkaline Phosphatase: 92 U/L (ref 38–126)
Anion gap: 10 (ref 5–15)
BUN: 10 mg/dL (ref 6–20)
CO2: 21 mmol/L — ABNORMAL LOW (ref 22–32)
Calcium: 9.1 mg/dL (ref 8.9–10.3)
Chloride: 108 mmol/L (ref 98–111)
Creatinine, Ser: 0.88 mg/dL (ref 0.61–1.24)
GFR, Estimated: >60 mL/min (ref >60)
Glucose, Bld: 104 mg/dL — ABNORMAL HIGH (ref 70–99)
Potassium: 3.1 mmol/L — ABNORMAL LOW (ref 3.5–5.1)
Sodium: 139 mmol/L (ref 135–145)
Total Bilirubin: 0.8 mg/dL (ref 0.0–1.2)
Total Protein: 7 g/dL (ref 6.5–8.1)

## 2024-04-07 LAB — I-STAT CHEM 8, ED
BUN: 11 mg/dL (ref 6–20)
Calcium, Ion: 1.17 mmol/L (ref 1.15–1.40)
Chloride: 106 mmol/L (ref 98–111)
Creatinine, Ser: 0.8 mg/dL (ref 0.61–1.24)
Glucose, Bld: 105 mg/dL — ABNORMAL HIGH (ref 70–99)
HCT: 47 % (ref 39.0–52.0)
Hemoglobin: 16 g/dL (ref 13.0–17.0)
Potassium: 3 mmol/L — ABNORMAL LOW (ref 3.5–5.1)
Sodium: 142 mmol/L (ref 135–145)
TCO2: 24 mmol/L (ref 22–32)

## 2024-04-07 LAB — SAMPLE TO BLOOD BANK

## 2024-04-07 LAB — ETHANOL: Alcohol, Ethyl (B): <15 mg/dL (ref <15)

## 2024-04-07 MED ORDER — MORPHINE SULFATE (PF) 4 MG/ML IV SOLN
4.0000 mg | Freq: Once | INTRAVENOUS | Status: AC
  Administered 2024-04-07: 4 mg via INTRAVENOUS
  Filled 2024-04-07: qty 1

## 2024-04-07 MED ORDER — IOHEXOL 350 MG/ML SOLN
75.0000 mL | Freq: Once | INTRAVENOUS | Status: AC | PRN
  Administered 2024-04-07: 75 mL via INTRAVENOUS

## 2024-04-07 MED ORDER — OXYCODONE HCL 5 MG PO TABS: 5.0000 mg | ORAL_TABLET | Freq: Three times a day (TID) | ORAL | 0 refills | Status: AC | PRN

## 2024-04-07 MED ORDER — POTASSIUM CHLORIDE CRYS ER 20 MEQ PO TBCR
20.0000 meq | EXTENDED_RELEASE_TABLET | Freq: Once | ORAL | Status: AC
  Administered 2024-04-07: 20 meq via ORAL
  Filled 2024-04-07: qty 1

## 2024-04-07 MED ORDER — TETANUS-DIPHTH-ACELL PERTUSSIS 5-2.5-18.5 LF-MCG/0.5 IM SUSY
0.5000 mL | PREFILLED_SYRINGE | Freq: Once | INTRAMUSCULAR | Status: AC
  Administered 2024-04-07: 0.5 mL via INTRAMUSCULAR
  Filled 2024-04-07: qty 0.5

## 2024-04-07 NOTE — Discharge Instructions (Addendum)
 Your evaluation today did not show evidence of acute injury.  Based upon the superficial abrasions of your neck these are things to look out for and reasons to come back to the emergency department: -Difficulty or change in speech -Difficulty with swallowing -Shortness of breath or difficulty breathing -Significant swelling of your neck  Please be sure to follow-up with your primary care provider to ensure resolution of your pain.  I am sending you home with pain medication to be taken every 8 hours as needed for severe pain.  Please be sure to take Tylenol  and ibuprofen  at home and if Tylenol  and ibuprofen  does not control your pain you may take the pain medication, Roxicodone , for severe pain
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
